# Patient Record
Sex: Female | Born: 1995 | Race: Black or African American | Hispanic: No | Marital: Single | Smoking: Never smoker
Health system: Southern US, Community
[De-identification: ages and names within clinical notes are randomized; demographics above are authoritative.]

## PROBLEM LIST (undated history)

## (undated) HISTORY — PX: FRACTURE SURGERY: SHX138

---
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
## 2015-09-26 ENCOUNTER — Encounter (HOSPITAL_COMMUNITY): Payer: Self-pay | Admitting: Emergency Medicine

## 2015-09-26 ENCOUNTER — Emergency Department (HOSPITAL_COMMUNITY)
Admission: EM | Admit: 2015-09-26 | Discharge: 2015-09-26 | Disposition: A | Discharge status: Home / Self Care | Payer: BLUE CROSS/BLUE SHIELD | Attending: Physician Assistant | Admitting: Physician Assistant

## 2015-09-26 DIAGNOSIS — R6889 Other general symptoms and signs: Secondary | ICD-10-CM

## 2015-09-26 DIAGNOSIS — R05 Cough: Secondary | ICD-10-CM | POA: Diagnosis present

## 2015-09-26 DIAGNOSIS — B349 Viral infection, unspecified: Secondary | ICD-10-CM | POA: Diagnosis not present

## 2015-09-26 MED ORDER — ONDANSETRON 4 MG PO TBDP: 4.0000 mg | ORAL_TABLET | Freq: Three times a day (TID) | ORAL | Status: DC | PRN

## 2015-09-26 MED ORDER — KETOROLAC TROMETHAMINE 60 MG/2ML IM SOLN
30.0000 mg | Freq: Once | INTRAMUSCULAR | Status: AC
  Administered 2015-09-26: 30 mg via INTRAMUSCULAR
  Filled 2015-09-26: qty 2

## 2015-09-26 MED ORDER — OSELTAMIVIR PHOSPHATE 75 MG PO CAPS: 75.0000 mg | ORAL_CAPSULE | Freq: Two times a day (BID) | ORAL | Status: DC

## 2015-09-26 MED ORDER — IBUPROFEN 800 MG PO TABS: 800.0000 mg | ORAL_TABLET | Freq: Three times a day (TID) | ORAL | Status: DC

## 2015-09-26 MED ORDER — ONDANSETRON 4 MG PO TBDP
4.0000 mg | ORAL_TABLET | Freq: Once | ORAL | Status: AC
  Administered 2015-09-26: 4 mg via ORAL
  Filled 2015-09-26: qty 1

## 2015-09-26 MED ORDER — OSELTAMIVIR PHOSPHATE 75 MG PO CAPS
75.0000 mg | ORAL_CAPSULE | Freq: Once | ORAL | Status: AC
  Administered 2015-09-26: 75 mg via ORAL
  Filled 2015-09-26: qty 1

## 2015-09-26 NOTE — ED Provider Notes (Signed)
CSN: [id number]     Arrival date & time 09/26/15  1901 History  By signing my name below, I, Linna DarnerRussell Turner, attest that this documentation has been prepared under the direction and in the presence of non-physician practitioner, Noelle PennerSerena Jackquelyn Sundberg, PA-C. Electronically Signed: Linna Darnerussell Turner, Scribe. 09/26/2015. 8:24 PM.     Chief Complaint  Patient presents with  . Influenza  . Cough  . Sore Throat    The history is provided by the patient. No language interpreter was used.     HPI Comments: Taylor Shea is a 20 y.o. female with no pertinent PMHx who presents to the Emergency Department complaining of constant, 6/10, flu-like symptoms onset two days ago. Pt endorses cough, nausea, vomiting, sore throat, headaches, and myalgias. Pt notes that she has been vomiting  3 times a day and notes that she is unable to keep fluids down. Pt reports that she feels nauseous currently. She notes that her sore throat is significantly worse than her cough but does not feel like this is strep throat, which she has had in the past. Pt has taken benadryl, 200 mg ibuprofen x3, and pepto bismol in the last 24 hours with no relief. She denies numbness, weakness, abdominal pain, urinary symptoms, or any other associated symptoms.    History reviewed. No pertinent past medical history. History reviewed. No pertinent past surgical history. History reviewed. No pertinent family history. Social History  Substance Use Topics  . Smoking status: Never Smoker   . Smokeless tobacco: Never Used  . Alcohol Use: No   OB History    No data available     Review of Systems  HENT: Positive for sore throat.   Respiratory: Positive for cough.   Gastrointestinal: Positive for nausea and vomiting.  Musculoskeletal: Positive for myalgias.  Neurological: Positive for headaches. Negative for numbness.  All other systems reviewed and are negative.     Allergies  Review of patient's allergies indicates no known  allergies.  Home Medications   Prior to Admission medications   Medication Sig Start Date End Date Taking? Authorizing Provider  diphenhydrAMINE (BENADRYL) 25 MG tablet Take 50 mg by mouth every 6 (six) hours as needed for itching.    Historical Provider, MD  diphenhydrAMINE (BENADRYL) 25 MG tablet Take 1 tablet (25 mg total) by mouth every 6 (six) hours as needed. 10/01/13   Shon Batonourtney F Horton, MD  predniSONE (DELTASONE) 20 MG tablet Take 3 tablets (60 mg total) by mouth once. 10/01/13   Shon Batonourtney F Horton, MD   BP 118/86 mmHg  Pulse 99  Temp(Src) 99 F (37.2 C) (Oral)  Resp 18  Ht 5\' 6"  (1.676 m)  Wt 204 lb (92.534 kg)  BMI 32.94 kg/m2  SpO2 99%  LMP 09/19/2015 (Exact Date) Physical Exam  Constitutional: She is oriented to person, place, and time. She appears well-developed and well-nourished. No distress.  HENT:  Head: Normocephalic and atraumatic.  Right Ear: External ear normal.  Left Ear: External ear normal.  Nose: Nose normal.  Mouth/Throat: Uvula is midline and mucous membranes are normal. No trismus in the jaw. Posterior oropharyngeal erythema present. No oropharyngeal exudate or posterior oropharyngeal edema.  Eyes: Conjunctivae and EOM are normal.  Neck: Neck supple. No tracheal deviation present.  Cardiovascular: Normal rate, regular rhythm and normal heart sounds.   Pulmonary/Chest: Effort normal and breath sounds normal. No respiratory distress. She has no wheezes. She has no rales.  Abdominal: Soft. Bowel sounds are normal. She exhibits no distension. There is  no tenderness.  Musculoskeletal: Normal range of motion.  Lymphadenopathy:    She has no cervical adenopathy.  Neurological: She is alert and oriented to person, place, and time.  Skin: Skin is warm and dry.  Psychiatric: She has a normal mood and affect. Her behavior is normal.  Nursing note and vitals reviewed.   ED Course  Procedures (including critical care time)  DIAGNOSTIC STUDIES: Oxygen  Saturation is 99% on RA, normal by my interpretation.    COORDINATION OF CARE: 8:24 PM Will administer Toradol 30 mg injection, tamiflu 75 mg capsule and Zofran 4 mg tablet. Discussed treatment plan with pt at bedside and pt agreed to plan.   Labs Review Labs Reviewed - No data to display  Imaging Review No results found.    EKG Interpretation None      MDM   Final diagnoses:  Flu-like symptoms  Viral syndrome    Suspect viral illness, likely the flu. Given onset of symptoms within 48h will treat with tamiflu. Toradol and Zofran given int he ED as well. Pt tolerating PO, states she feels hungry. She is stable for discharge. Rx given for tamiflu, zofran, ibuprofen. Work note given. Instructed to f/u with PCP within one week. ER return precautions given.  I personally performed the services described in this documentation, which was scribed in my presence. The recorded information has been reviewed and is accurate.     Carlene Coria, PA-C 09/26/15 2113  Courteney Randall An, MD 09/28/15 [phone]

## 2015-09-26 NOTE — Discharge Instructions (Signed)
Take medications as prescribed. Return to the emergency room for worsening condition or new concerning symptoms. Follow up with your regular doctor. If you don't have a regular doctor use one of the numbers below to establish a primary care doctor. ° ° °Emergency Department Resource Guide °1) Find a Doctor and Pay Out of Pocket °Although you won't have to find out who is covered by your insurance plan, it is a good idea to ask around and get recommendations. You will then need to call the office and see if the doctor you have chosen will accept you as a new patient and what types of options they offer for patients who are self-pay. Some doctors offer discounts or will set up payment plans for their patients who do not have insurance, but you will need to ask so you aren't surprised when you get to your appointment. ° °2) Contact Your Local Health Department °Not all health departments have doctors that can see patients for sick visits, but many do, so it is worth a call to see if yours does. If you don't know where your local health department is, you can check in your phone book. The CDC also has a tool to help you locate your state's health department, and many state websites also have listings of all of their local health departments. ° °3) Find a Walk-in Clinic °If your illness is not likely to be very severe or complicated, you may want to try a walk in clinic. These are popping up all over the country in pharmacies, drugstores, and shopping centers. They're usually staffed by nurse practitioners or physician assistants that have been trained to treat common illnesses and complaints. They're usually fairly quick and inexpensive. However, if you have serious medical issues or chronic medical problems, these are probably not your best option. ° °No Primary Care Doctor: °- Call [facility] at  832-8000 - they can help you locate a primary care doctor that  accepts your insurance, provides certain services,  etc. °- Physician Referral Service- 1-800-533-3463 ° °Emergency Department Resource Guide °1) Find a Doctor and Pay Out of Pocket °Although you won't have to find out who is covered by your insurance plan, it is a good idea to ask around and get recommendations. You will then need to call the office and see if the doctor you have chosen will accept you as a new patient and what types of options they offer for patients who are self-pay. Some doctors offer discounts or will set up payment plans for their patients who do not have insurance, but you will need to ask so you aren't surprised when you get to your appointment. ° °2) Contact Your Local Health Department °Not all health departments have doctors that can see patients for sick visits, but many do, so it is worth a call to see if yours does. If you don't know where your local health department is, you can check in your phone book. The CDC also has a tool to help you locate your state's health department, and many state websites also have listings of all of their local health departments. ° °3) Find a Walk-in Clinic °If your illness is not likely to be very severe or complicated, you may want to try a walk in clinic. These are popping up all over the country in pharmacies, drugstores, and shopping centers. They're usually staffed by nurse practitioners or physician assistants that have been trained to treat common illnesses and complaints. They're usually fairly   quick and inexpensive. However, if you have serious medical issues or chronic medical problems, these are probably not your best option. ° °No Primary Care Doctor: °- Call [facility] at  832-8000 - they can help you locate a primary care doctor that  accepts your insurance, provides certain services, etc. °- Physician Referral Service- 1-800-533-3463 ° °Chronic Pain Problems: °Organization         Address  Phone   Notes  °[facility]  ([phone]) [phone] Patients need to be referred by  their primary care doctor.  ° °Medication Assistance: °Organization         Address  Phone   Notes  °[place] [facility] [address] °[place], [place] [address] ([phone]) 641-8030 --Must be a resident of [place] °-- Must have NO insurance coverage whatsoever (no Medicaid/ Medicare, etc.) °-- The pt. MUST have a primary care doctor that directs their care regularly and follows them in the community °  °[facility]  (866) 331-1348   °[employer]  (888) 892-1162   ° °Agencies that provide inexpensive medical care: °Organization         Address  Phone   Notes  °[facility]  ([phone]) [phone]   °[facility]    (336) 832-7272   °[facility] [address] °[place], [place] [address] ([phone]) 832-4777   °[facility] [address], °[place] (336) [phone]   °[facility]    (336) 373-0678   °[facility]    (336) 272-1050   °[facility] ° [address], [place] Phone:  (336) [phone], Fax:  ([phone]) [phone] Hours of Operation:  9 am - 6 pm, M-F.  Also accepts Medicaid/Medicare and self-pay.  °[facility] ° [address], [place] Phone: (336) [phone], Fax: ([phone]) [phone]. Hours of Operation:  8:30 am - 5:30 pm, M-F.  Also accepts Medicaid and self-pay.  °[facility] [place] [address], [place] Phone: ([phone]) [phone]   °[facility] [address], [place], [place] (336)723-1848, Ext. 123 Mondays & Thursdays: 7-9 AM.  First 15 patients are seen on a first come, first serve basis. °  ° °Medicaid-accepting [place] Providers: ° °Organization         Address  Phone   Notes  °[facility] [address], [place] ([phone]) [phone] Also accepts self-pay patients.  °[facility] [address], [place] ° ([phone]) [phone]   °[facility]  [address], [place] ([phone]) [phone]   °[facility] [address] [place] [address], [place] (336) [phone]   °[facility] [address], [place]  ° ([phone]) [phone] Only accepts [place] Access Medicaid patients after they have their name applied to their card.  ° °Self-Pay (no insurance) in [place]: ° °Organization         Address  Phone   Notes  °Sickle Cell Patients, [facility] [address], [place] ([phone]) [phone]   °[facility] [address],  ([phone]) [phone]   °[facility] ° [address], [place] ([phone]) [phone]   °[facility]/Dr. Osei-Bonsu ° [address] [place] [address],  or [address], [place] [phone]) [phone] Phone number   for both [place] and [place] locations is the same.  °[facility] [address], [place] ([phone]) [phone]   °[facility] [address] [place] [address], [place] or [address] ([phone]) [phone] °(336) [phone]   °[facility] [address], [place] (336) [phone], phone; ([phone]) [phone], fax Sees patients 1st and 3rd Saturday of every month.  Must not qualify for public or private insurance (i.e. Medicaid, Medicare, [place] Health Choice, Veterans' Benefits) • Household income should be no more than 200% of the poverty level •The clinic cannot treat you if you are pregnant or think you are pregnant • Sexually transmitted diseases are not treated at the clinic.  ° ° ° °

## 2015-09-26 NOTE — ED Notes (Signed)
Patient here with numerous complaints. Endorses cough, sore throat, nausea, vomiting, diarrhea, fever up to 101 at home, and body aches. States onset 2 days ago. 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

## 2015-10-03 ENCOUNTER — Emergency Department (HOSPITAL_COMMUNITY): Payer: BLUE CROSS/BLUE SHIELD

## 2015-10-03 ENCOUNTER — Encounter (HOSPITAL_COMMUNITY): Payer: Self-pay | Admitting: *Deleted

## 2015-10-03 ENCOUNTER — Emergency Department (HOSPITAL_COMMUNITY)
Admission: EM | Admit: 2015-10-03 | Discharge: 2015-10-04 | Disposition: A | Discharge status: Home / Self Care | Payer: BLUE CROSS/BLUE SHIELD | Attending: Emergency Medicine | Admitting: Emergency Medicine

## 2015-10-03 DIAGNOSIS — J029 Acute pharyngitis, unspecified: Secondary | ICD-10-CM | POA: Diagnosis not present

## 2015-10-03 DIAGNOSIS — R509 Fever, unspecified: Secondary | ICD-10-CM | POA: Diagnosis not present

## 2015-10-03 DIAGNOSIS — R05 Cough: Secondary | ICD-10-CM | POA: Diagnosis present

## 2015-10-03 DIAGNOSIS — R112 Nausea with vomiting, unspecified: Secondary | ICD-10-CM | POA: Diagnosis not present

## 2015-10-03 DIAGNOSIS — Z7952 Long term (current) use of systemic steroids: Secondary | ICD-10-CM | POA: Diagnosis not present

## 2015-10-03 DIAGNOSIS — M791 Myalgia: Secondary | ICD-10-CM | POA: Diagnosis not present

## 2015-10-03 DIAGNOSIS — Z791 Long term (current) use of non-steroidal anti-inflammatories (NSAID): Secondary | ICD-10-CM | POA: Insufficient documentation

## 2015-10-03 DIAGNOSIS — Z3202 Encounter for pregnancy test, result negative: Secondary | ICD-10-CM | POA: Insufficient documentation

## 2015-10-03 DIAGNOSIS — R51 Headache: Secondary | ICD-10-CM | POA: Insufficient documentation

## 2015-10-03 DIAGNOSIS — R6889 Other general symptoms and signs: Secondary | ICD-10-CM

## 2015-10-03 DIAGNOSIS — R197 Diarrhea, unspecified: Secondary | ICD-10-CM | POA: Insufficient documentation

## 2015-10-03 DIAGNOSIS — Z79899 Other long term (current) drug therapy: Secondary | ICD-10-CM | POA: Diagnosis not present

## 2015-10-03 LAB — BASIC METABOLIC PANEL
ANION GAP: 11 (ref 5–15)
BUN: 8 mg/dL (ref 6–20)
CO2: 26 mmol/L (ref 22–32)
Calcium: 9.5 mg/dL (ref 8.9–10.3)
Chloride: 102 mmol/L (ref 101–111)
Creatinine, Ser: 0.66 mg/dL (ref 0.44–1.00)
GFR calc non Af Amer: >60 mL/min (ref >60)
Glucose, Bld: 98 mg/dL (ref 65–99)
POTASSIUM: 3.8 mmol/L (ref 3.5–5.1)
Sodium: 139 mmol/L (ref 135–145)

## 2015-10-03 LAB — CBC WITH DIFFERENTIAL/PLATELET
BASOS ABS: 0 10*3/uL (ref 0.0–0.1)
BASOS PCT: 0 %
Eosinophils Absolute: 0.3 10*3/uL (ref 0.0–0.7)
Eosinophils Relative: 4 %
HEMATOCRIT: 38.5 % (ref 36.0–46.0)
Hemoglobin: 12.7 g/dL (ref 12.0–15.0)
Lymphocytes Relative: 42 %
Lymphs Abs: 3.4 10*3/uL (ref 0.7–4.0)
MCH: 27.6 pg (ref 26.0–34.0)
MCHC: 33 g/dL (ref 30.0–36.0)
MCV: 83.7 fL (ref 78.0–100.0)
Monocytes Absolute: 0.5 10*3/uL (ref 0.1–1.0)
Monocytes Relative: 6 %
NEUTROS ABS: 3.9 10*3/uL (ref 1.7–7.7)
Neutrophils Relative %: 48 %
Platelets: 325 10*3/uL (ref 150–400)
RBC: 4.6 MIL/uL (ref 3.87–5.11)
RDW: 11.9 % (ref 11.5–15.5)
WBC: 8.1 10*3/uL (ref 4.0–10.5)

## 2015-10-03 LAB — URINALYSIS, ROUTINE W REFLEX MICROSCOPIC
Bilirubin Urine: NEGATIVE
GLUCOSE, UA: NEGATIVE mg/dL
Hgb urine dipstick: NEGATIVE
Ketones, ur: NEGATIVE mg/dL
Leukocytes, UA: NEGATIVE
Nitrite: NEGATIVE
PROTEIN: NEGATIVE mg/dL
SPECIFIC GRAVITY, URINE: 1.022 (ref 1.005–1.030)
pH: 5.5 (ref 5.0–8.0)

## 2015-10-03 LAB — RAPID STREP SCREEN (MED CTR MEBANE ONLY): Streptococcus, Group A Screen (Direct): NEGATIVE

## 2015-10-03 MED ORDER — ONDANSETRON HCL 4 MG PO TABS: 4.0000 mg | ORAL_TABLET | Freq: Four times a day (QID) | ORAL | Status: DC

## 2015-10-03 NOTE — ED Provider Notes (Signed)
CSN: [id number]     Arrival date & time 10/03/15  1817 History  By signing my name below, I, Taylor Shea, attest that this documentation has been prepared under the direction and in the presence of Laci Frenkel, PA-C. Electronically Signed: Octavia Shea, ED Scribe. 10/03/2015. 7:17 PM.     Chief Complaint  Patient presents with  . Fever  . Emesis  . Sore Throat   The history is provided by the patient. No language interpreter was used.   HPI Comments: Taylor Shea is a 20 y.o. female who presents to the Emergency Department complaining of intermittent, gradual worsening, moderate, flu-like symptoms onset one week ago. She was evaluated in ED on 3/6 for same complaints. Discharged with tamiflu, zofran and ibuprofen. Reports improvement in symptoms but then they worsened again over the weekend. Pt has been having associated throbbing headache, n/v/d, generalized body aches, sore throat, productive cough that brings up clear sputum, and fever (TMax 102). Pt endorses pain when swallowing but denies difficulty handling secretions or breathing. She states that she has vomited every day for the past week with most recent episode PTA. She denies current nausea. Denies blood in vomit or stool. She has been taking Tylenol to alleviate her symptoms with no relief. 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 Denies dizziness, lightheadedness, syncope, ear pain, congestion, rhinorrhea, neck pain, neck stiffness, chest pain, SOB, abdominal pain or dysuria. She did not receive her flu shot.   History reviewed. No pertinent past medical history. History reviewed. No pertinent past surgical history. History reviewed. No pertinent family history. Social History  Substance Use Topics  . Smoking status: Never Smoker   . Smokeless tobacco: Never Used  . Alcohol Use: No   OB History    No data available     Review of Systems  Constitutional: Positive for fever and  chills.  HENT: Positive for sore throat. Negative for congestion, rhinorrhea and trouble swallowing.   Eyes: Negative for discharge.  Respiratory: Positive for cough. Negative for shortness of breath.   Cardiovascular: Negative for chest pain.  Gastrointestinal: Positive for nausea, vomiting and diarrhea. Negative for abdominal pain.  Genitourinary: Negative for dysuria.  Musculoskeletal: Positive for myalgias. Negative for neck pain.  Neurological: Positive for headaches. Negative for dizziness, syncope and light-headedness.  All other systems reviewed and are negative.     Allergies  Review of patient's allergies indicates no known allergies.  Home Medications   Prior to Admission medications   Medication Sig Start Date End Date Taking? Authorizing Provider  diphenhydrAMINE (BENADRYL) 25 MG tablet Take 50 mg by mouth every 6 (six) hours as needed for itching.    Historical Provider, MD  diphenhydrAMINE (BENADRYL) 25 MG tablet Take 1 tablet (25 mg total) by mouth every 6 (six) hours as needed. 10/01/13   Shon Baton, MD  ibuprofen (ADVIL,MOTRIN) 800 MG tablet Take 1 tablet (800 mg total) by mouth 3 (three) times daily. 09/26/15   Ace Gins Sam, PA-C  ondansetron (ZOFRAN ODT) 4 MG disintegrating tablet Take 1 tablet (4 mg total) by mouth every 8 (eight) hours as needed for nausea or vomiting. 09/26/15   Ace Gins Sam, PA-C  ondansetron (ZOFRAN) 4 MG tablet Take 1 tablet (4 mg total) by mouth every 6 (six) hours. 10/03/15   Lear Carstens, PA-C  oseltamivir (TAMIFLU) 75 MG capsule Take 1 capsule (75 mg total) by mouth every 12 (twelve) hours. 09/26/15   Carlene Coria, PA-C  predniSONE (DELTASONE) 20 MG tablet Take 3 tablets (60 mg total) by mouth once. 10/01/13   Shon Batonourtney F Horton, MD   Triage vitals: BP 113/101 mmHg  Pulse 76  Temp(Src) 98.3 F (36.8 C) (Oral)  Resp 16  SpO2 100%  LMP 09/19/2015 (Exact Date) Physical Exam  Constitutional: She appears well-developed and well-nourished.  No distress.  Endorses nausea and vomiting PTA. Pt eating chips and drinking water while in ED exam room. Non-toxic appearing  HENT:  Head: Normocephalic and atraumatic.  Right Ear: Tympanic membrane and ear canal normal.  Left Ear: Tympanic membrane and ear canal normal.  Nose: Nose normal. No mucosal edema or rhinorrhea. Right sinus exhibits no maxillary sinus tenderness and no frontal sinus tenderness. Left sinus exhibits no maxillary sinus tenderness and no frontal sinus tenderness.  Mouth/Throat: Uvula is midline, oropharynx is clear and moist and mucous membranes are normal. No oropharyngeal exudate, posterior oropharyngeal edema or posterior oropharyngeal erythema.  Eyes: Conjunctivae are normal. Right eye exhibits no discharge. Left eye exhibits no discharge. No scleral icterus.  Neck: Normal range of motion. Neck supple.  No meningeal signs  Cardiovascular: Normal rate, regular rhythm and normal heart sounds.   Pulmonary/Chest: Effort normal and breath sounds normal. No respiratory distress. She has no wheezes.  Abdominal: Soft. There is no tenderness.  Musculoskeletal: Normal range of motion.  Lymphadenopathy:    She has no cervical adenopathy.  Neurological: She is alert. Coordination normal.  Skin: Skin is warm and dry.  Psychiatric: She has a normal mood and affect. Her behavior is normal.  Nursing note and vitals reviewed.   ED Course  Procedures  DIAGNOSTIC STUDIES: Oxygen Saturation is 100% on RA, normal by my interpretation.  COORDINATION OF CARE:  7:17 PM Discussed treatment plan which includes lab work and swab throat with pt at bedside and pt agreed to plan.  Labs Review Labs Reviewed  URINALYSIS, ROUTINE W REFLEX MICROSCOPIC (NOT AT [facility]) - Abnormal; Notable for the following:    APPearance CLOUDY (*)    All other components within normal limits  RAPID STREP SCREEN (NOT AT [facility])  CULTURE, GROUP A STREP ([facility])  CBC WITH DIFFERENTIAL/PLATELET  BASIC METABOLIC  PANEL  POC URINE PREG, ED    Imaging Review Dg Chest 2 View  10/03/2015  CLINICAL DATA:  Fever and cough for 1 week. EXAM: CHEST  2 VIEW COMPARISON:  None. FINDINGS: The cardiac silhouette, mediastinal and hilar contours are within normal limits. The lungs are clear. No pleural effusion. The bony thorax intact. IMPRESSION: No acute cardiopulmonary findings. Electronically Signed   By: Rudie MeyerP.  Gallerani M.D.   On: 10/03/2015 19:50   I have personally reviewed and evaluated these images and lab results as part of my medical decision-making.   EKG Interpretation None      MDM   Final diagnoses:  Flu-like symptoms   20 year old female presenting with multiple complaints x one week. Seen in the ED for same complaints one week ago and diagnosed with flu. Reports improved but then returned over the weekend. Complains of fevers, chills, headaches, cough, sore throat, nausea, vomiting and diarrhea. No improvement on Tamiflu. Afebrile and nontoxic appearing. TMs clear bilaterally. No sinus tenderness. No nasal congestion noted. No oropharyngeal erythema or exudate. No meningeal signs. Lungs clear to auscultation bilaterally. Abdomen is soft, nontender without peritoneal signs. Blood work is unremarkable. Chest x-ray negative. Rapid strep negative. Patient is tolerating by PO in emergency department. Patient presentation consistent with viral illness. Will discharge with  Zofran and instruction to follow up with her PCP if symptoms do not improve. Return precautions given in discharge paperwork and discussed with pt at bedside. Pt stable for discharge  I personally performed the services described in this documentation, which was scribed in my presence. The recorded information has been reviewed and is accurate.   Alveta Heimlich, PA-C 10/03/15 1610  Margarita Grizzle, MD 10/04/15 [id number]

## 2015-10-03 NOTE — ED Notes (Signed)
Patient reports no nausea or difficulty drinking.  Has consumed 200 ml of water without a problem.

## 2015-10-03 NOTE — Discharge Instructions (Signed)
Viral Infections °A viral infection can be caused by different types of viruses. Most viral infections are not serious and resolve on their own. However, some infections may cause severe symptoms and may lead to further complications. °SYMPTOMS °Viruses can frequently cause: °· Minor sore throat. °· Aches and pains. °· Headaches. °· Runny nose. °· Different types of rashes. °· Watery eyes. °· Tiredness. °· Cough. °· Loss of appetite. °· Gastrointestinal infections, resulting in nausea, vomiting, and diarrhea. °These symptoms do not respond to antibiotics because the infection is not caused by bacteria. However, you might catch a bacterial infection following the viral infection. This is sometimes called a "superinfection." Symptoms of such a bacterial infection may include: °· Worsening sore throat with pus and difficulty swallowing. °· Swollen neck glands. °· Chills and a high or persistent fever. °· Severe headache. °· Tenderness over the sinuses. °· Persistent overall ill feeling (malaise), muscle aches, and tiredness (fatigue). °· Persistent cough. °· Yellow, green, or brown mucus production with coughing. °HOME CARE INSTRUCTIONS  °· Only take over-the-counter or prescription medicines for pain, discomfort, diarrhea, or fever as directed by your caregiver. °· Drink enough water and fluids to keep your urine clear or pale yellow. Sports drinks can provide valuable electrolytes, sugars, and hydration. °· Get plenty of rest and maintain proper nutrition. Soups and broths with crackers or rice are fine. °SEEK IMMEDIATE MEDICAL CARE IF:  °· You have severe headaches, shortness of breath, chest pain, neck pain, or an unusual rash. °· You have uncontrolled vomiting, diarrhea, or you are unable to keep down fluids. °· You or your child has an oral temperature above 102° F (38.9° C), not controlled by medicine. °· Your baby is older than 3 months with a rectal temperature of 102° F (38.9° C) or higher. °· Your baby is 3  months old or younger with a rectal temperature of 100.4° F (38° C) or higher. °MAKE SURE YOU:  °· Understand these instructions. °· Will watch your condition. °· Will get help right away if you are not doing well or get worse. °  °This information is not intended to replace advice given to you by your health care provider. Make sure you discuss any questions you have with your health care provider. °  °Document Released: 04/18/2005 Document Revised: 10/01/2011 Document Reviewed: 12/15/2014 °Elsevier Interactive Patient Education ©2016 [employer] ° °

## 2015-10-03 NOTE — ED Notes (Signed)
PT reports she has not tried to rest since she was last seen in ED.

## 2015-10-03 NOTE — ED Notes (Signed)
Pt presents with ongoing N/V, sore throat the patient last seen 09-24-15

## 2015-10-03 NOTE — ED Notes (Signed)
PT reports she eels better until the meds wear off. Pt reports she went to work today.

## 2015-10-03 NOTE — ED Notes (Signed)
PT eating a bag of chips while vitals signs were done by NT.

## 2015-10-04 LAB — POC URINE PREG, ED: Preg Test, Ur: NEGATIVE

## 2015-10-06 LAB — CULTURE, GROUP A STREP (THRC)

## 2017-09-07 ENCOUNTER — Other Ambulatory Visit: Payer: Self-pay

## 2017-09-07 ENCOUNTER — Encounter (HOSPITAL_COMMUNITY): Payer: Self-pay | Admitting: Emergency Medicine

## 2017-09-07 ENCOUNTER — Emergency Department (HOSPITAL_COMMUNITY)
Admission: EM | Admit: 2017-09-07 | Discharge: 2017-09-08 | Disposition: A | Discharge status: Home / Self Care | Payer: BLUE CROSS/BLUE SHIELD | Attending: Emergency Medicine | Admitting: Emergency Medicine

## 2017-09-07 DIAGNOSIS — R69 Illness, unspecified: Secondary | ICD-10-CM

## 2017-09-07 DIAGNOSIS — R0981 Nasal congestion: Secondary | ICD-10-CM | POA: Diagnosis present

## 2017-09-07 DIAGNOSIS — J111 Influenza due to unidentified influenza virus with other respiratory manifestations: Secondary | ICD-10-CM | POA: Insufficient documentation

## 2017-09-07 MED ORDER — KETOROLAC TROMETHAMINE 60 MG/2ML IM SOLN
30.0000 mg | Freq: Once | INTRAMUSCULAR | Status: AC
  Administered 2017-09-07: 30 mg via INTRAMUSCULAR
  Filled 2017-09-07: qty 2

## 2017-09-07 MED ORDER — OXYMETAZOLINE HCL 0.05 % NA SOLN: 1.0000 | Freq: Once | NASAL | Status: DC

## 2017-09-07 MED ORDER — OXYMETAZOLINE HCL 0.05 % NA SOLN: 2.0000 | Freq: Two times a day (BID) | NASAL | Status: DC | PRN

## 2017-09-07 MED ORDER — PROMETHAZINE HCL 25 MG PO TABS
25.0000 mg | ORAL_TABLET | Freq: Once | ORAL | Status: AC
  Administered 2017-09-07: 25 mg via ORAL
  Filled 2017-09-07: qty 1

## 2017-09-07 MED ORDER — BENZONATATE 100 MG PO CAPS
200.0000 mg | ORAL_CAPSULE | Freq: Once | ORAL | Status: AC
  Administered 2017-09-07: 200 mg via ORAL
  Filled 2017-09-07: qty 2

## 2017-09-07 NOTE — ED Notes (Signed)
EDP at bedside  

## 2017-09-07 NOTE — ED Provider Notes (Signed)
[facility] EMERGENCY DEPARTMENT Provider Note   CSN: [id number] Arrival date & time: 09/07/17  1943     History   Chief Complaint Chief Complaint  Patient presents with  . Cough  . Fever    HPI Taylor Shea is a 22 y.o. female.   22 year old female presents to the emergency department for 2 days of nasal congestion and sinus pressure.  Symptoms have been associated with a cough productive of yellow sputum.  She notes increased discomfort in her abdomen with coughing, but no abdominal pain at rest.  Patient further noting diffuse body aches as well as fever up to 102 F.  She has not had a fever since yesterday evening.  Patient also noting nausea with no vomiting.  This has caused anorexia as oral intake does aggravate symptoms.  She has been around her boyfriend's daughter who was sick with an upper respiratory infection.  No other known sick contacts.  She did not receive the flu shot this year.      History reviewed. No pertinent past medical history.  There are no active problems to display for this patient.   Past Surgical History:  Procedure Laterality Date  . FRACTURE SURGERY      OB History    No data available       Home Medications    Prior to Admission medications   Medication Sig Start Date End Date Taking? Authorizing Provider  diphenhydrAMINE (BENADRYL) 25 MG tablet Take 50 mg by mouth every 6 (six) hours as needed for itching.   Yes [provider]  loratadine (CLARITIN) 10 MG tablet Take 10 mg by mouth daily as needed for allergies.   Yes [provider]  ondansetron (ZOFRAN ODT) 4 MG disintegrating tablet Take 1 tablet (4 mg total) by mouth every 8 (eight) hours as needed for nausea or vomiting. 09/26/15  Yes Sam, Serena Y, PA-C  benzonatate (TESSALON) 100 MG capsule Take 1 capsule (100 mg total) by mouth every 8 (eight) hours. 09/08/17   Antony Madura, PA-C  cetirizine-pseudoephedrine (ZYRTEC-D) 5-120 MG tablet  Take 1 tablet by mouth daily. 09/08/17   Antony Madura, PA-C  ibuprofen (ADVIL,MOTRIN) 600 MG tablet Take 1 tablet (600 mg total) by mouth every 6 (six) hours as needed. 09/08/17   Antony Madura, PA-C  promethazine (PHENERGAN) 25 MG tablet Take 1 tablet (25 mg total) by mouth every 6 (six) hours as needed for nausea or vomiting. 09/08/17   Antony Madura, PA-C    Family History No family history on file.  Social History Social History   Tobacco Use  . Smoking status: Never Smoker  . Smokeless tobacco: Never Used  Substance Use Topics  . Alcohol use: No  . Drug use: No     Allergies   Blueberry [vaccinium angustifolium] and Pineapple   Review of Systems Review of Systems Ten systems reviewed and are negative for acute change, except as noted in the HPI.    Physical Exam Updated Vital Signs BP (!) 144/64   Pulse 91   Temp 98.1 F (36.7 C) (Oral)   Resp 18   Ht 5\' 6"  (1.676 m)   LMP 09/07/2017 (Exact Date)   SpO2 100%   BMI 32.93 kg/m   Physical Exam  Constitutional: She is oriented to person, place, and time. She appears well-developed and well-nourished. No distress.  Nontoxic appearing and in no acute distress  HENT:  Head: Normocephalic and atraumatic.  Mouth/Throat: Mucous membranes are normal.  She  is tolerating secretions without difficulty.  Mucosal edema noted.  Eyes: Conjunctivae and EOM are normal. No scleral icterus.  Neck: Normal range of motion.  Cardiovascular: Normal rate, regular rhythm and intact distal pulses.  Patient not tachycardic as noted in triage  Pulmonary/Chest: Effort normal. No stridor. No respiratory distress. She has no wheezes. She has no rales.  Respirations even and unlabored.  Lungs clear to auscultation bilaterally  Abdominal: Soft. She exhibits no distension and no mass. There is no tenderness. There is no guarding.  Soft, nontender, nondistended abdomen  Musculoskeletal: Normal range of motion.  Neurological: She is alert and  oriented to person, place, and time. She exhibits normal muscle tone. Coordination normal.  GCS 15. Patient moving all extremities.  Skin: Skin is warm and dry. No rash noted. She is not diaphoretic. No erythema. No pallor.  Psychiatric: She has a normal mood and affect. Her behavior is normal.  Nursing note and vitals reviewed.    ED Treatments / Results  Labs (all labs ordered are listed, but only abnormal results are displayed) Labs Reviewed  INFLUENZA PANEL BY PCR (TYPE A & B)    EKG  EKG Interpretation None       Radiology No results found.  Procedures Procedures (including critical care time)  Medications Ordered in ED Medications  oxymetazoline (AFRIN) 0.05 % nasal spray 2 spray (not administered)  ketorolac (TORADOL) injection 30 mg (30 mg Intramuscular Given 09/07/17 2341)  benzonatate (TESSALON) capsule 200 mg (200 mg Oral Given 09/07/17 2339)  promethazine (PHENERGAN) tablet 25 mg (25 mg Oral Given 09/07/17 2340)     Initial Impression / Assessment and Plan / ED Course  I have reviewed the triage vital signs and the nursing notes.  Pertinent labs & imaging results that were available during my care of the patient were reviewed by me and considered in my medical decision making (see chart for details).     Patient with symptoms consistent with flu-like illness.  Hx of sick contacts with similar symptoms.  Vitals are stable in the ED, afebrile.  Reports Tmax of 102F yesterday.  Lungs are clear.  No signs of respiratory distress or hypoxia.  Symptoms managed in the ED with Afrin, Toradol, Tessalon, Phenergan.  Plan for discharge with supportive care instructions.  Patient will also be given a cough suppressant.  Return precautions discussed and provided. Patient discharged in stable condition with no unaddressed concerns.   Final Clinical Impressions(s) / ED Diagnoses   Final diagnoses:  Influenza-like illness    ED Discharge Orders        Ordered     benzonatate (TESSALON) 100 MG capsule  Every 8 hours     09/08/17 0019    ibuprofen (ADVIL,MOTRIN) 600 MG tablet  Every 6 hours PRN     09/08/17 0019    cetirizine-pseudoephedrine (ZYRTEC-D) 5-120 MG tablet  Daily     09/08/17 0019    promethazine (PHENERGAN) 25 MG tablet  Every 6 hours PRN     09/08/17 0021       Antony MaduraHumes, Sydney Azure, PA-C 09/08/17 [id number]    Schlossman, Erin, MD 09/08/17 1406

## 2017-09-07 NOTE — ED Triage Notes (Signed)
Pt to ED with c/o sinus pressure, productive cough,  Fever and nausea. X's 2 days

## 2017-09-08 LAB — INFLUENZA PANEL BY PCR (TYPE A & B)
Influenza A By PCR: NEGATIVE
Influenza B By PCR: NEGATIVE

## 2017-09-08 MED ORDER — PROMETHAZINE HCL 25 MG PO TABS: 25.0000 mg | ORAL_TABLET | Freq: Four times a day (QID) | ORAL | 0 refills | Status: AC | PRN

## 2017-09-08 MED ORDER — CETIRIZINE-PSEUDOEPHEDRINE ER 5-120 MG PO TB12: 1.0000 | ORAL_TABLET | Freq: Every day | ORAL | 0 refills | Status: AC

## 2017-09-08 MED ORDER — BENZONATATE 100 MG PO CAPS: 100.0000 mg | ORAL_CAPSULE | Freq: Three times a day (TID) | ORAL | 0 refills | Status: AC

## 2017-09-08 MED ORDER — IBUPROFEN 600 MG PO TABS: 600.0000 mg | ORAL_TABLET | Freq: Four times a day (QID) | ORAL | 0 refills | Status: AC | PRN

## 2017-09-08 NOTE — Discharge Instructions (Signed)
We recommend Afrin, 2 puffs in each nostril daily, for congestion.  Do not use this past an additional 48 hours.  You have also been prescribed Zyrtec-D for management of nasal congestion.  We advised ibuprofen for body aches or fever.  You may take thousand milligrams Tylenol every 8 hours for headaches or fever.  Take Tessalon as prescribed for cough management.  Follow-up with a primary care doctor to ensure resolution of symptoms.

## 2018-12-06 ENCOUNTER — Other Ambulatory Visit: Payer: Self-pay

## 2018-12-06 ENCOUNTER — Emergency Department (HOSPITAL_COMMUNITY)
Admission: EM | Admit: 2018-12-06 | Discharge: 2018-12-06 | Disposition: A | Discharge status: Home / Self Care | Payer: BLUE CROSS/BLUE SHIELD | Attending: Emergency Medicine | Admitting: Emergency Medicine

## 2018-12-06 ENCOUNTER — Emergency Department (HOSPITAL_COMMUNITY): Payer: BLUE CROSS/BLUE SHIELD

## 2018-12-06 ENCOUNTER — Encounter (HOSPITAL_COMMUNITY): Payer: Self-pay | Admitting: Emergency Medicine

## 2018-12-06 DIAGNOSIS — Y999 Unspecified external cause status: Secondary | ICD-10-CM | POA: Insufficient documentation

## 2018-12-06 DIAGNOSIS — Y929 Unspecified place or not applicable: Secondary | ICD-10-CM | POA: Insufficient documentation

## 2018-12-06 DIAGNOSIS — Y9383 Activity, rough housing and horseplay: Secondary | ICD-10-CM | POA: Diagnosis not present

## 2018-12-06 DIAGNOSIS — M542 Cervicalgia: Secondary | ICD-10-CM | POA: Insufficient documentation

## 2018-12-06 DIAGNOSIS — W500XXA Accidental hit or strike by another person, initial encounter: Secondary | ICD-10-CM | POA: Diagnosis not present

## 2018-12-06 DIAGNOSIS — S0990XA Unspecified injury of head, initial encounter: Secondary | ICD-10-CM | POA: Diagnosis present

## 2018-12-06 DIAGNOSIS — Z79899 Other long term (current) drug therapy: Secondary | ICD-10-CM | POA: Insufficient documentation

## 2018-12-06 MED ORDER — ONDANSETRON 4 MG PO TBDP: 4.0000 mg | ORAL_TABLET | Freq: Four times a day (QID) | ORAL | 0 refills | Status: AC | PRN
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
## 2018-12-06 NOTE — Discharge Instructions (Signed)
Please avoid alcohol for the next week.  Please rest and drink plenty of water.  We recommend that you avoid any activity that may lead to another head injury for at least 1 week or until your symptoms have completely resolved.  We also recommend "brain rest" - please avoid TV, cell phones, tablets, computers as much as possible for the next 48 hours.     You may alternate Tylenol 1000 mg every 6 hours as needed for pain and Ibuprofen 800 mg every 8 hours as needed for pain.  Please take Ibuprofen with food.
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
## 2020-08-05 IMAGING — CT CT CERVICAL SPINE WITHOUT CONTRAST
4 of 7 series · 14 of 33 positions shown, 15 images · non-contrast
Comparison: None.

CLINICAL DATA: Headache and neck pain

EXAM:
CT HEAD WITHOUT CONTRAST
CT CERVICAL SPINE WITHOUT CONTRAST
TECHNIQUE: Multidetector CT imaging of the head and cervical spine was
performed following the standard protocol without intravenous
contrast. Multiplanar CT image reconstructions of the cervical spine
were also generated.

[Series 5: c spine soft · axial · 0.26mm/px · z∈[-152,-120]mm · 2 of 79 slices shown]
[im 16/79  soft-tissue]
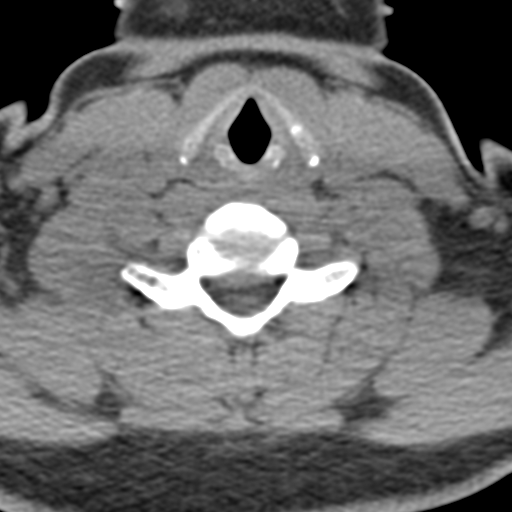
[im 32/79  soft-tissue]
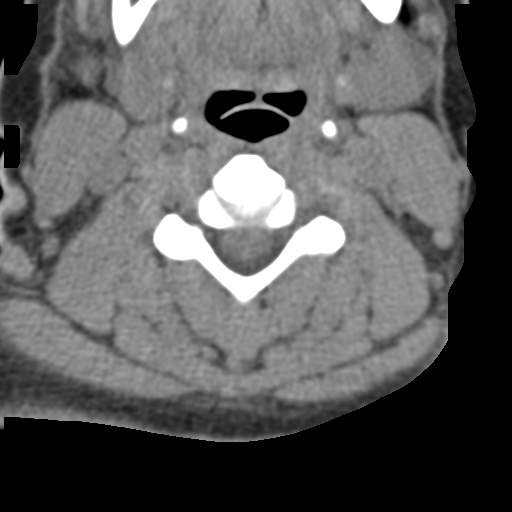

[Series 9: coronal soft tissue · coronal · 0.29mm/px · 3 of 71 slices shown]
[im 18/71  bone]
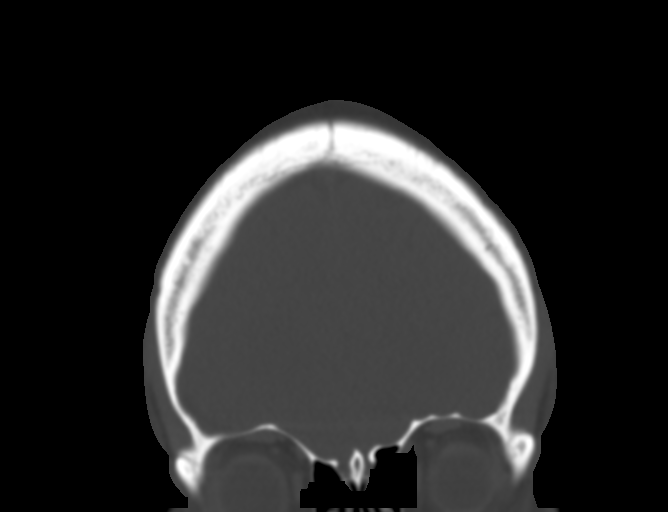
[im 36/71  bone]
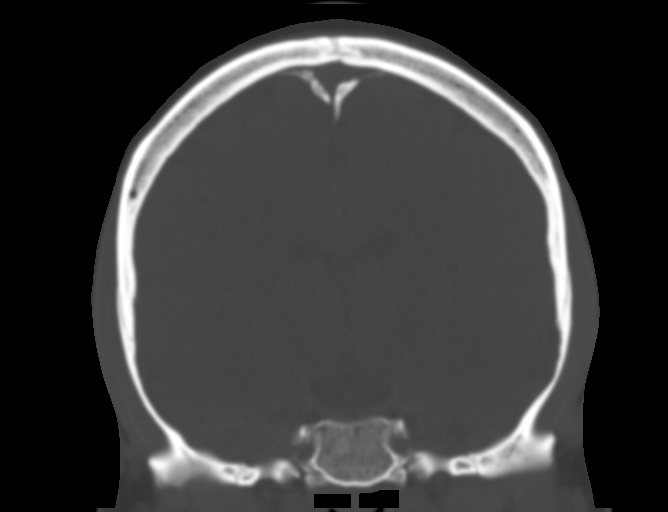
[im 53/71  bone]
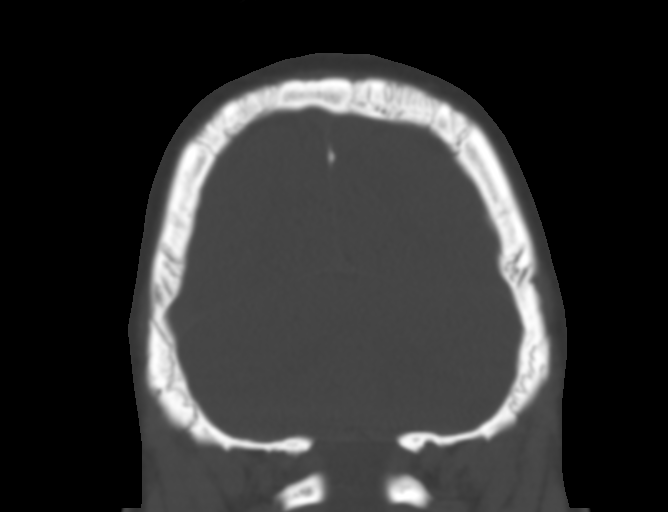

[Series 11: orthogonal bone · axial · 0.23mm/px · z∈[-181,-81]mm · 4 of 88 slices shown, 5 images]
[im 18/88  soft-tissue]
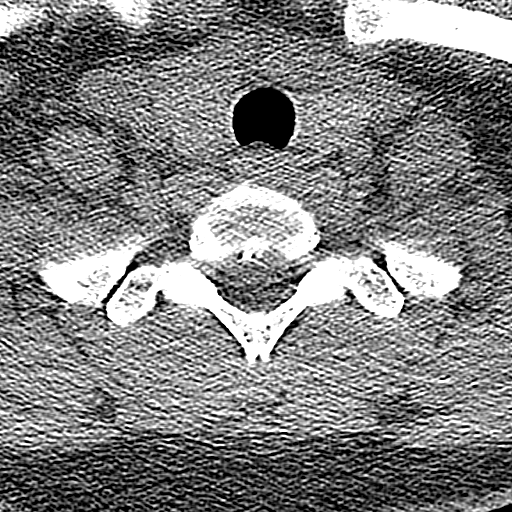
[im 18/88  bone]
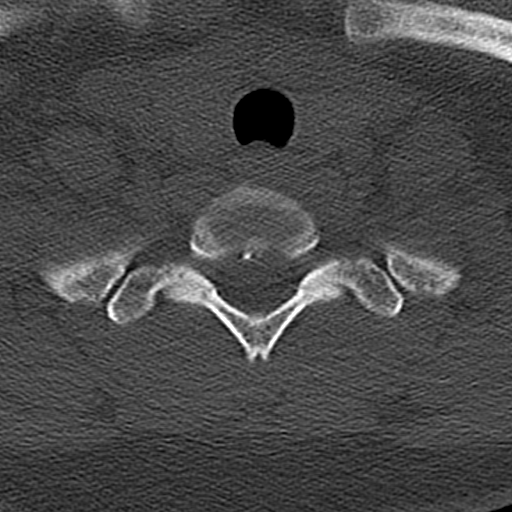
[im 35/88  bone]
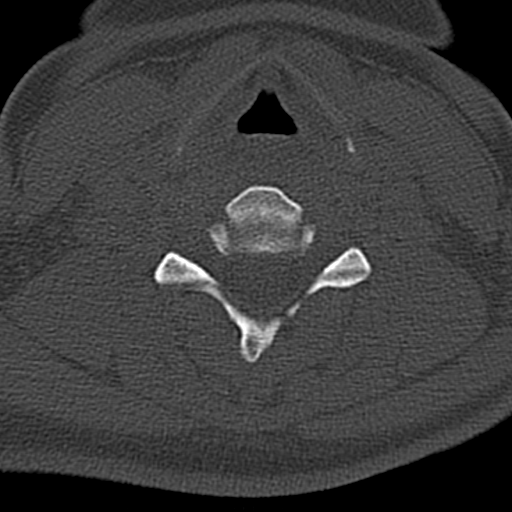
[im 53/88  bone]
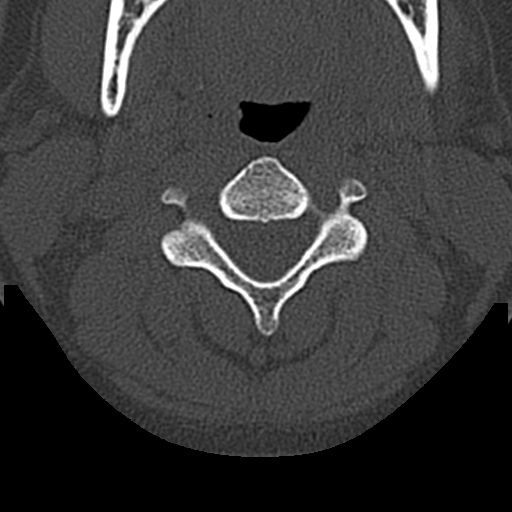
[im 70/88  bone]
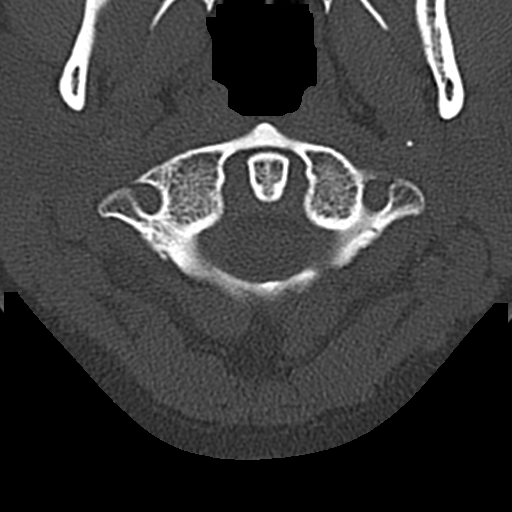

[Series 13: sagittal bone · sagittal · 0.23mm/px · 5 of 61 slices shown]
[im 11/61  bone]
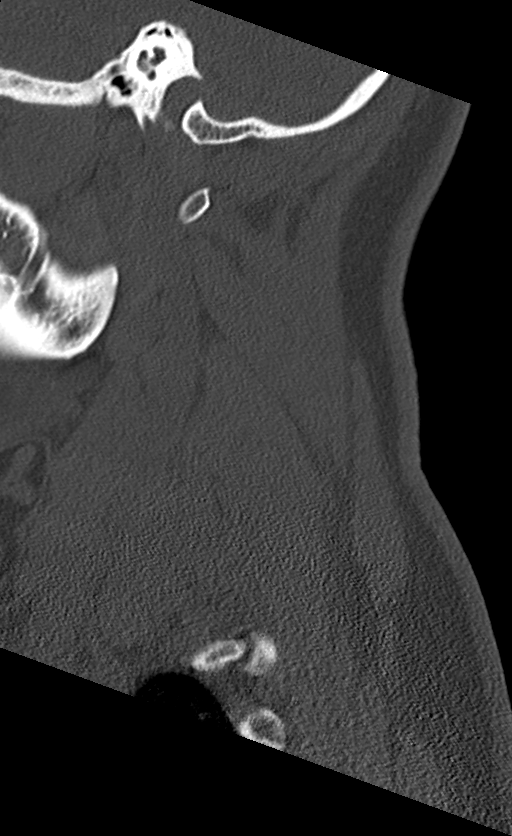
[im 21/61  bone]
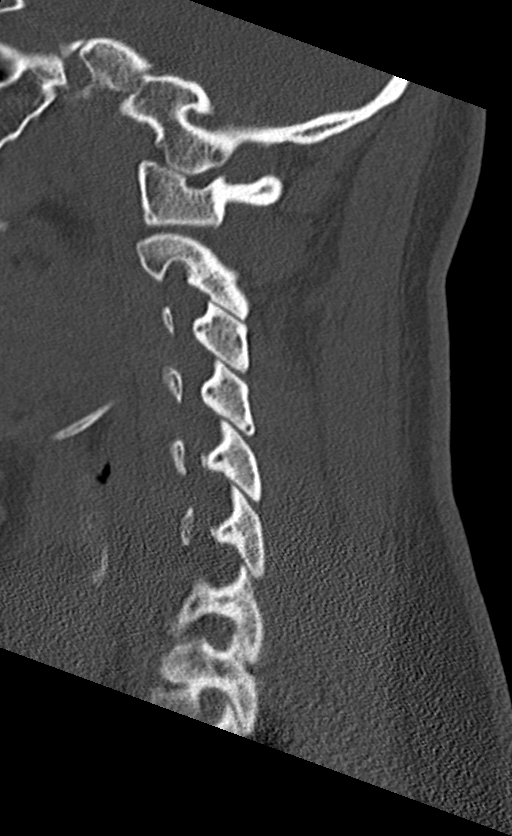
[im 31/61  bone]
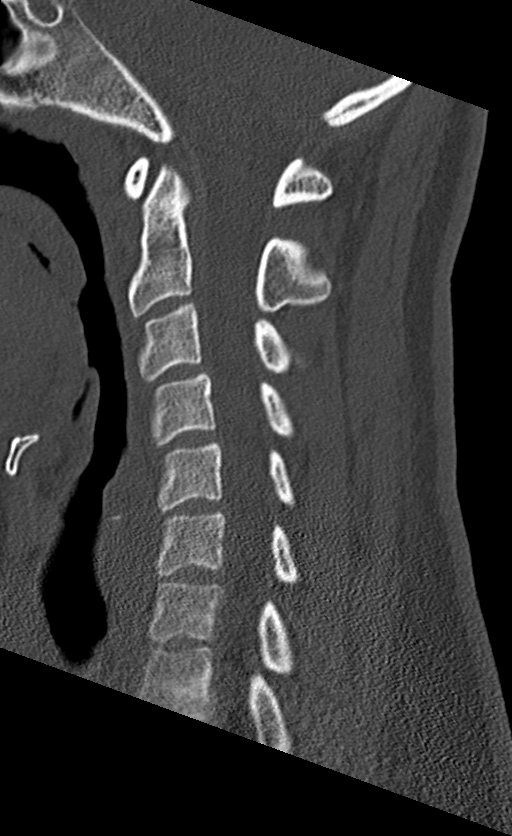
[im 41/61  bone]
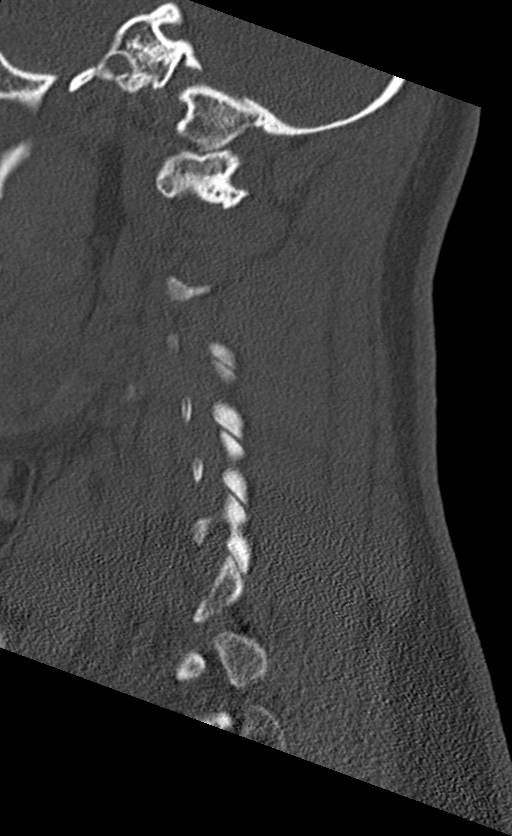
[im 51/61  bone]
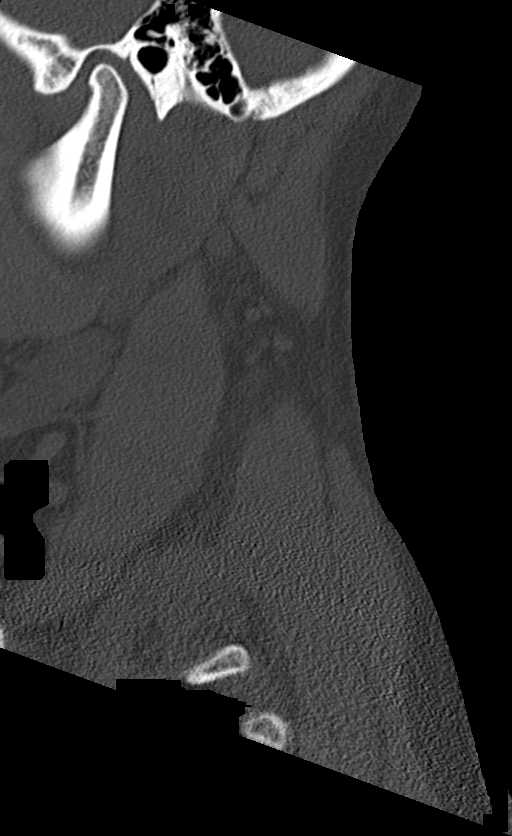

[14 of 33 positions shown; findings below may reference images not displayed]

FINDINGS: CT HEAD FINDINGS

Brain: There is no mass, hemorrhage or extra-axial collection. The
size and configuration of the ventricles and extra-axial CSF spaces
are normal. The brain parenchyma is normal, without evidence of
acute or chronic infarction.

Vascular: No abnormal hyperdensity of the major intracranial
arteries or dural venous sinuses. No intracranial atherosclerosis.

Skull: The visualized skull base, calvarium and extracranial soft
tissues are normal.

Sinuses/Orbits: No fluid levels or advanced mucosal thickening of
the visualized paranasal sinuses. No mastoid or middle ear effusion.
The orbits are normal.

CT CERVICAL SPINE FINDINGS

Alignment: No static subluxation. Facets are aligned. Occipital
condyles are normally positioned.

Skull base and vertebrae: No acute fracture.

Soft tissues and spinal canal: No prevertebral fluid or swelling. No
visible canal hematoma.

Disc levels: No advanced spinal canal or neural foraminal stenosis.

Upper chest: No pneumothorax, pulmonary nodule or pleural effusion.

Other: Normal visualized paraspinal cervical soft tissues.
IMPRESSION: Normal head and cervical spine.

## 2021-04-27 ENCOUNTER — Emergency Department (HOSPITAL_COMMUNITY)
Admission: EM | Admit: 2021-04-27 | Discharge: 2021-04-27 | Disposition: A | Discharge status: Home / Self Care | Payer: BC Managed Care – PPO | Attending: Emergency Medicine | Admitting: Emergency Medicine

## 2021-04-27 ENCOUNTER — Encounter (HOSPITAL_COMMUNITY): Payer: Self-pay | Admitting: *Deleted

## 2021-04-27 ENCOUNTER — Other Ambulatory Visit: Payer: Self-pay

## 2021-04-27 DIAGNOSIS — R109 Unspecified abdominal pain: Secondary | ICD-10-CM | POA: Diagnosis not present

## 2021-04-27 DIAGNOSIS — R197 Diarrhea, unspecified: Secondary | ICD-10-CM | POA: Insufficient documentation

## 2021-04-27 DIAGNOSIS — Z5321 Procedure and treatment not carried out due to patient leaving prior to being seen by health care provider: Secondary | ICD-10-CM | POA: Insufficient documentation

## 2021-04-27 DIAGNOSIS — R112 Nausea with vomiting, unspecified: Secondary | ICD-10-CM | POA: Insufficient documentation

## 2021-04-27 LAB — COMPREHENSIVE METABOLIC PANEL
ALT: 15 U/L (ref 0–44)
AST: 21 U/L (ref 15–41)
Albumin: 4 g/dL (ref 3.5–5.0)
Alkaline Phosphatase: 56 U/L (ref 38–126)
Anion gap: 6 (ref 5–15)
BUN: 11 mg/dL (ref 6–20)
CO2: 24 mmol/L (ref 22–32)
Calcium: 9 mg/dL (ref 8.9–10.3)
Chloride: 106 mmol/L (ref 98–111)
Creatinine, Ser: 0.87 mg/dL (ref 0.44–1.00)
GFR, Estimated: >60 mL/min (ref >60)
Glucose, Bld: 115 mg/dL — ABNORMAL HIGH (ref 70–99)
Potassium: 3.8 mmol/L (ref 3.5–5.1)
Sodium: 136 mmol/L (ref 135–145)
Total Bilirubin: 0.9 mg/dL (ref 0.3–1.2)
Total Protein: 7.7 g/dL (ref 6.5–8.1)

## 2021-04-27 LAB — CBC WITH DIFFERENTIAL/PLATELET
Abs Immature Granulocytes: 0.04 10*3/uL (ref 0.00–0.07)
Basophils Absolute: 0 10*3/uL (ref 0.0–0.1)
Basophils Relative: 0 %
Eosinophils Absolute: 0 10*3/uL (ref 0.0–0.5)
Eosinophils Relative: 0 %
HCT: 40.3 % (ref 36.0–46.0)
Hemoglobin: 13.4 g/dL (ref 12.0–15.0)
Immature Granulocytes: 1 %
Lymphocytes Relative: 3 %
Lymphs Abs: 0.3 10*3/uL — ABNORMAL LOW (ref 0.7–4.0)
MCH: 29.6 pg (ref 26.0–34.0)
MCHC: 33.3 g/dL (ref 30.0–36.0)
MCV: 89.2 fL (ref 80.0–100.0)
Monocytes Absolute: 0.5 10*3/uL (ref 0.1–1.0)
Monocytes Relative: 6 %
Neutro Abs: 6.8 10*3/uL (ref 1.7–7.7)
Neutrophils Relative %: 90 %
Platelets: 243 10*3/uL (ref 150–400)
RBC: 4.52 MIL/uL (ref 3.87–5.11)
RDW: 12 % (ref 11.5–15.5)
WBC: 7.6 10*3/uL (ref 4.0–10.5)
nRBC: 0 % (ref 0.0–0.2)

## 2021-04-27 LAB — I-STAT BETA HCG BLOOD, ED (MC, WL, AP ONLY): I-stat hCG, quantitative: <5 m[IU]/mL (ref <5)

## 2021-04-27 LAB — LIPASE, BLOOD: Lipase: 24 U/L (ref 11–51)

## 2021-04-27 MED ORDER — MORPHINE SULFATE (PF) 4 MG/ML IV SOLN: 4.0000 mg | Freq: Once | INTRAVENOUS | Status: DC

## 2021-04-27 MED ORDER — LACTATED RINGERS IV SOLN: INTRAVENOUS | Status: DC

## 2021-04-27 MED ORDER — LACTATED RINGERS IV BOLUS: 1000.0000 mL | Freq: Once | INTRAVENOUS | Status: DC

## 2021-04-27 MED ORDER — ONDANSETRON HCL 4 MG/2ML IJ SOLN: 4.0000 mg | Freq: Once | INTRAMUSCULAR | Status: DC

## 2021-04-27 NOTE — ED Provider Notes (Signed)
[facility]-EMERGENCY DEPT Provider Note   CSN: [id number] Arrival date & time: 04/27/21  1020     History Chief Complaint  Patient presents with   Abdominal Pain   Emesis    Taylor Shea is a 25 y.o. female.  25 year old female presents with acute onset of nausea vomiting abdominal discomfort that started last night.  Concern for possible food poisoning.  States she has had periumbilical abdominal discomfort without radiation.  States she is on menstrual cycle at this time.  Denies any hematuria or dysuria.  Pain is been persistent and worse with movement.  No treatment use prior to arrival      History reviewed. No pertinent past medical history.  There are no problems to display for this patient.   Past Surgical History:  Procedure Laterality Date   FRACTURE SURGERY       OB History   No obstetric history on file.     No family history on file.  Social History   Tobacco Use   Smoking status: Never   Smokeless tobacco: Never  Substance Use Topics   Alcohol use: No   Drug use: No    Home Medications Prior to Admission medications   Medication Sig Start Date End Date Taking? Authorizing Provider  benzonatate (TESSALON) 100 MG capsule Take 1 capsule (100 mg total) by mouth every 8 (eight) hours. 09/08/17   Antony Madura, PA-C  cetirizine-pseudoephedrine (ZYRTEC-D) 5-120 MG tablet Take 1 tablet by mouth daily. 09/08/17   Antony Madura, PA-C  diphenhydrAMINE (BENADRYL) 25 MG tablet Take 50 mg by mouth every 6 (six) hours as needed for itching.    [provider]  ibuprofen (ADVIL,MOTRIN) 600 MG tablet Take 1 tablet (600 mg total) by mouth every 6 (six) hours as needed. 09/08/17   Antony Madura, PA-C  loratadine (CLARITIN) 10 MG tablet Take 10 mg by mouth daily as needed for allergies.    [provider]  ondansetron (ZOFRAN ODT) 4 MG disintegrating tablet Take 1 tablet (4 mg total) by mouth every 6 (six) hours as needed for  nausea or vomiting. 12/06/18   Ward, Layla Maw, DO  promethazine (PHENERGAN) 25 MG tablet Take 1 tablet (25 mg total) by mouth every 6 (six) hours as needed for nausea or vomiting. 09/08/17   Antony Madura, PA-C    Allergies    Blueberry [vaccinium angustifolium] and Pineapple  Review of Systems   Review of Systems  All other systems reviewed and are negative.  Physical Exam Updated Vital Signs BP (!) 141/94 (BP Location: Right Arm)   Pulse (!) 105   Temp 99.8 F (37.7 C) (Oral)   Resp 18   LMP 04/27/2021   SpO2 100%   Physical Exam Vitals and nursing note reviewed.  Constitutional:      General: She is not in acute distress.    Appearance: Normal appearance. She is well-developed. She is not toxic-appearing.  HENT:     Head: Normocephalic and atraumatic.  Eyes:     General: Lids are normal.     Conjunctiva/sclera: Conjunctivae normal.     Pupils: Pupils are equal, round, and reactive to light.  Neck:     Thyroid: No thyroid mass.     Trachea: No tracheal deviation.  Cardiovascular:     Rate and Rhythm: Normal rate and regular rhythm.     Heart sounds: Normal heart sounds. No murmur heard.   No gallop.  Pulmonary:     Effort: Pulmonary effort is  normal. No respiratory distress.     Breath sounds: Normal breath sounds. No stridor. No decreased breath sounds, wheezing, rhonchi or rales.  Abdominal:     General: There is no distension.     Palpations: Abdomen is soft.     Tenderness: There is abdominal tenderness in the periumbilical area. There is guarding. There is no rebound.  Musculoskeletal:        General: No tenderness. Normal range of motion.     Cervical back: Normal range of motion and neck supple.  Skin:    General: Skin is warm and dry.     Findings: No abrasion or rash.  Neurological:     Mental Status: She is alert and oriented to person, place, and time. Mental status is at baseline.     GCS: GCS eye subscore is 4. GCS verbal subscore is 5. GCS motor  subscore is 6.     Cranial Nerves: Cranial nerves are intact. No cranial nerve deficit.     Sensory: No sensory deficit.     Motor: Motor function is intact.  Psychiatric:        Attention and Perception: Attention normal.        Speech: Speech normal.        Behavior: Behavior normal.    ED Results / Procedures / Treatments   Labs (all labs ordered are listed, but only abnormal results are displayed) Labs Reviewed  LIPASE, BLOOD  COMPREHENSIVE METABOLIC PANEL  URINALYSIS, ROUTINE W REFLEX MICROSCOPIC  CBC WITH DIFFERENTIAL/PLATELET  I-STAT BETA HCG BLOOD, ED ([facility], [facility], [facility] ONLY)    EKG None  Radiology No results found.  Procedures Procedures   Medications Ordered in ED Medications  lactated ringers bolus 1,000 mL (has no administration in time range)  lactated ringers infusion (has no administration in time range)  ondansetron (ZOFRAN) injection 4 mg (has no administration in time range)  morphine 4 MG/ML injection 4 mg (has no administration in time range)    ED Course  I have reviewed the triage vital signs and the nursing notes.  Pertinent labs & imaging results that were available during my care of the patient were reviewed by me and considered in my medical decision making (see chart for details).    MDM Rules/Calculators/A&P                           Patient had labs and x-rays ordered.  Patient eloped from the department Final Clinical Impression(s) / ED Diagnoses Final diagnoses:  None    Rx / DC Orders ED Discharge Orders     None        Lorre Nick, MD 05/01/21 1603

## 2021-04-27 NOTE — ED Triage Notes (Signed)
Pt complains of n/v/d and abd pain since last night, concerned for food poisoning.
